# Patient Record
Sex: Female | Born: 1960 | Race: White | Hispanic: No | Smoking: Current every day smoker
Health system: Southern US, Community
[De-identification: ages and names within clinical notes are randomized; demographics above are authoritative.]

## PROBLEM LIST (undated history)

## (undated) DIAGNOSIS — A499 Bacterial infection, unspecified: Secondary | ICD-10-CM

## (undated) HISTORY — PX: BREAST LUMPECTOMY: SHX2

---

## 2015-10-22 ENCOUNTER — Emergency Department (HOSPITAL_COMMUNITY)
Admission: EM | Admit: 2015-10-22 | Discharge: 2015-10-22 | Disposition: A | Discharge status: Home / Self Care | Payer: BLUE CROSS/BLUE SHIELD | Attending: Emergency Medicine | Admitting: Emergency Medicine

## 2015-10-22 ENCOUNTER — Emergency Department (HOSPITAL_COMMUNITY): Payer: BLUE CROSS/BLUE SHIELD

## 2015-10-22 ENCOUNTER — Encounter (HOSPITAL_COMMUNITY): Payer: Self-pay | Admitting: Cardiology

## 2015-10-22 DIAGNOSIS — F17200 Nicotine dependence, unspecified, uncomplicated: Secondary | ICD-10-CM | POA: Insufficient documentation

## 2015-10-22 DIAGNOSIS — Z79899 Other long term (current) drug therapy: Secondary | ICD-10-CM | POA: Insufficient documentation

## 2015-10-22 DIAGNOSIS — R112 Nausea with vomiting, unspecified: Secondary | ICD-10-CM | POA: Diagnosis not present

## 2015-10-22 DIAGNOSIS — R531 Weakness: Secondary | ICD-10-CM | POA: Diagnosis present

## 2015-10-22 DIAGNOSIS — Z791 Long term (current) use of non-steroidal anti-inflammatories (NSAID): Secondary | ICD-10-CM | POA: Diagnosis not present

## 2015-10-22 HISTORY — DX: Bacterial infection, unspecified: A49.9

## 2015-10-22 LAB — LIPASE, BLOOD: Lipase: 28 U/L (ref 11–51)

## 2015-10-22 LAB — COMPREHENSIVE METABOLIC PANEL
ALBUMIN: 4.4 g/dL (ref 3.5–5.0)
ALT: 10 U/L — ABNORMAL LOW (ref 14–54)
ANION GAP: 9 (ref 5–15)
AST: 16 U/L (ref 15–41)
Alkaline Phosphatase: 68 U/L (ref 38–126)
BUN: 10 mg/dL (ref 6–20)
CO2: 25 mmol/L (ref 22–32)
Calcium: 9.7 mg/dL (ref 8.9–10.3)
Chloride: 103 mmol/L (ref 101–111)
Creatinine, Ser: 0.94 mg/dL (ref 0.44–1.00)
GFR calc Af Amer: >60 mL/min (ref >60)
GFR calc non Af Amer: >60 mL/min (ref >60)
GLUCOSE: 144 mg/dL — AB (ref 65–99)
POTASSIUM: 3.4 mmol/L — AB (ref 3.5–5.1)
SODIUM: 137 mmol/L (ref 135–145)
Total Bilirubin: 0.4 mg/dL (ref 0.3–1.2)
Total Protein: 7.8 g/dL (ref 6.5–8.1)

## 2015-10-22 LAB — CBC WITH DIFFERENTIAL/PLATELET
BASOS PCT: 0 %
Basophils Absolute: 0 10*3/uL (ref 0.0–0.1)
EOS ABS: 0 10*3/uL (ref 0.0–0.7)
Eosinophils Relative: 0 %
HCT: 40.4 % (ref 36.0–46.0)
Hemoglobin: 12.9 g/dL (ref 12.0–15.0)
Lymphocytes Relative: 25 %
Lymphs Abs: 2.4 10*3/uL (ref 0.7–4.0)
MCH: 28.2 pg (ref 26.0–34.0)
MCHC: 31.9 g/dL (ref 30.0–36.0)
MCV: 88.4 fL (ref 78.0–100.0)
MONO ABS: 0.7 10*3/uL (ref 0.1–1.0)
MONOS PCT: 7 %
NEUTROS PCT: 68 %
Neutro Abs: 6.4 10*3/uL (ref 1.7–7.7)
Platelets: 296 10*3/uL (ref 150–400)
RBC: 4.57 MIL/uL (ref 3.87–5.11)
RDW: 13.5 % (ref 11.5–15.5)
WBC: 9.5 10*3/uL (ref 4.0–10.5)

## 2015-10-22 LAB — URINALYSIS, ROUTINE W REFLEX MICROSCOPIC
Bilirubin Urine: NEGATIVE
Glucose, UA: NEGATIVE mg/dL
Ketones, ur: NEGATIVE mg/dL
LEUKOCYTES UA: NEGATIVE
NITRITE: NEGATIVE
PH: 6 (ref 5.0–8.0)
Protein, ur: NEGATIVE mg/dL
SPECIFIC GRAVITY, URINE: 1.01 (ref 1.005–1.030)

## 2015-10-22 LAB — URINE MICROSCOPIC-ADD ON

## 2015-10-22 LAB — TROPONIN I

## 2015-10-22 LAB — TSH: TSH: 0.51 u[IU]/mL (ref 0.350–4.500)

## 2015-10-22 LAB — MAGNESIUM: MAGNESIUM: 2.1 mg/dL (ref 1.7–2.4)

## 2015-10-22 MED ORDER — SODIUM CHLORIDE 0.9 % IV BOLUS (SEPSIS)
1000.0000 mL | Freq: Once | INTRAVENOUS | Status: AC
  Administered 2015-10-22: 1000 mL via INTRAVENOUS

## 2015-10-22 MED ORDER — ONDANSETRON 4 MG PO TBDP: 4.0000 mg | ORAL_TABLET | Freq: Three times a day (TID) | ORAL | Status: AC | PRN

## 2015-10-22 MED ORDER — ONDANSETRON 4 MG PO TBDP
4.0000 mg | ORAL_TABLET | Freq: Once | ORAL | Status: AC
  Administered 2015-10-22: 4 mg via ORAL
  Filled 2015-10-22: qty 1

## 2015-10-22 NOTE — ED Notes (Signed)
Pt states she is feeling better after first liter of fluids.

## 2015-10-22 NOTE — Discharge Instructions (Signed)
Your testing has been normal - see list below for follow up.  [facility]    Kari BaarsEdward Hawkins MD. Specialty: Pulmonary Disease Contact information: [address]  [address]  [place] [address]  [phone]   Syliva OvermanMargaret Simpson, MD. Specialty: [facility] Contact information: [address]  [place] [address]  [phone]   Lilyan PuntScott Luking, MD. Specialty: [facility] Contact information: [address]  [address]  [place] [address]  [phone]   Avon Gullyesfaye Fanta, MD Specialty: Internal Medicine Contact information: [address]  [place] [address]  [phone]   Catalina PizzaZach Hall, MD. Specialty: Internal Medicine Contact information: [address]  [place] [address]  [phone]   Butch PennyAngus Mcinnis, MD. Specialty: Family Medicine Contact information: [address]  [place] [address]  [phone]   John GiovanniStephen Knowlton, MD. Specialty: [facility] Contact information: [address]  [address]  [place] [address]  [phone]   Carylon Perchesoy Fagan, MD. Specialty: Internal Medicine Contact information: [address]  [address]  [place] [address]  [phone]

## 2015-10-22 NOTE — ED Notes (Signed)
Weakness and nausea times one week.

## 2015-10-22 NOTE — ED Provider Notes (Signed)
CSN: [id number]     Arrival date & time 10/22/15  1330 History   First MD Initiated Contact with Patient 10/22/15 1349     Chief Complaint  Patient presents with  . Weakness     (Consider location/radiation/quality/duration/timing/severity/associated sxs/prior Treatment) HPI  The patient is a 55 year old female, she denies any prior major medical issues or surgical problems, she does endorse [facility] one [facility] during which time she had what she describes as a bacterial infection of her bowels. He was told she needed a colonoscopy but did not follow up. Over the last year she has done very well without any complaints however approximately one week ago she developed nausea without vomiting and generalized fatigue which has seemed to worsen over the last week. She is called out of work yesterday because of her symptoms. She denies fevers, chills, coughing, shortness of breath, chest pain, abdominal pain, back pain, swelling, rashes, headaches, blurred vision or sore throat. She does endorse having a couple of tick bites over the last couple of months but states that she is always pulling ticks off of her and has never had any rashes. She denies any fevers. She has had very little to eat or drink the last 2 days.  Past Medical History  Diagnosis Date  . Bacterial infection    Past Surgical History  Procedure Laterality Date  . Breast lumpectomy     History reviewed. No pertinent family history. Social History  Substance Use Topics  . Smoking status: Current Every Day Smoker  . Smokeless tobacco: None  . Alcohol Use: No   OB History    No data available     Review of Systems  All other systems reviewed and are negative.     Allergies  Review of patient's allergies indicates no known allergies.  Home Medications   Prior to Admission medications   Medication Sig Start Date End Date Taking? Authorizing Provider  ibuprofen (ADVIL,MOTRIN)  200 MG tablet Take 400 mg by mouth every 6 (six) hours as needed for moderate pain.   Yes Historical Provider, MD  Multiple Vitamin (MULTIVITAMIN WITH MINERALS) TABS tablet Take 1 tablet by mouth daily.   Yes Historical Provider, MD  ranitidine (ZANTAC) 150 MG capsule Take 150 mg by mouth daily as needed for heartburn.   Yes Historical Provider, MD  ondansetron (ZOFRAN ODT) 4 MG disintegrating tablet Take 1 tablet (4 mg total) by mouth every 8 (eight) hours as needed for nausea. 10/22/15   Eber HongBrian Kade Rickels, MD   BP 113/72 mmHg  Pulse 51  Temp(Src) 98.1 F (36.7 C) (Oral)  Resp 24  Ht 5\' 8"  (1.727 m)  Wt 140 lb (63.504 kg)  BMI 21.29 kg/m2  SpO2 97%  LMP  Physical Exam  Constitutional: She appears well-developed and well-nourished. No distress.  HENT:  Head: Normocephalic and atraumatic.  Mouth/Throat: No oropharyngeal exudate.  Mucous membranes are mildly dehydrated  Eyes: Conjunctivae and EOM are normal. Pupils are equal, round, and reactive to light. Right eye exhibits no discharge. Left eye exhibits no discharge. No scleral icterus.  Neck: Normal range of motion. Neck supple. No JVD present. No thyromegaly present.  Cardiovascular: Regular rhythm, normal heart sounds and intact distal pulses.  Exam reveals no gallop and no friction rub.   No murmur heard. Tachycardia, strong pulses, no JVD  Pulmonary/Chest: Effort normal and breath sounds normal. No respiratory distress. She has no wheezes. She has no rales.  Abdominal: Soft. Bowel sounds  are normal. She exhibits no distension and no mass. There is tenderness ( m minimal epigastric tenderness, abdominal aorta palpated).  Musculoskeletal: Normal range of motion. She exhibits no edema or tenderness.  Lymphadenopathy:    She has no cervical adenopathy.  Neurological: She is alert. Coordination normal.  Normal strength and sensation of all 4 extremities.  Speech is clear, cranial nerves III through XII are normal, gait is normal,  coordination is normal  Skin: Skin is warm and dry. No rash noted. No erythema.  Psychiatric: She has a normal mood and affect. Her behavior is normal.  Nursing note and vitals reviewed.   ED Course  Procedures (including critical care time) Labs Review Labs Reviewed  COMPREHENSIVE METABOLIC PANEL - Abnormal; Notable for the following:    Potassium 3.4 (*)    Glucose, Bld 144 (*)    ALT 10 (*)    All other components within normal limits  URINALYSIS, ROUTINE W REFLEX MICROSCOPIC (NOT AT [facility]) - Abnormal; Notable for the following:    Hgb urine dipstick MODERATE (*)    All other components within normal limits  URINE MICROSCOPIC-ADD ON - Abnormal; Notable for the following:    Squamous Epithelial / LPF 0-5 (*)    Bacteria, UA RARE (*)    All other components within normal limits  CBC WITH DIFFERENTIAL/PLATELET  LIPASE, BLOOD  MAGNESIUM  TROPONIN I  TSH    Imaging Review US Aorta  10/22/2015  CLINICAL DATA:  Pulsatile abdominal mass. EXAM: ULTRASOUND OF ABDOMINAL AORTA TECHNIQUE: Ultrasound examination of the abdominal aorta was performed to evaluate for abdominal aortic aneurysm. COMPARISON:  None. FINDINGS: Abdominal Aorta Proximal:  2.3 x 2.4 cm Mid:  1.8 x 1.4 cm Distal:  1.2 x 1.2 cm Right Common iliac artery:  9 x 9 mm Left Common iliac artery:  8 x 9 mm Small amount of calcified plaque noted. IMPRESSION: No abdominal aortic aneurysm. Electronically Signed   By: Rudie Meyer M.D.   On: 10/22/2015 14:48   I have personally reviewed and evaluated these images and lab results as part of my medical decision-making.   EKG Interpretation   Date/Time:  Friday October 22 2015 14:48:26 EDT Ventricular Rate:  54 PR Interval:  161 QRS Duration: 89 QT Interval:  433 QTC Calculation: 410 R Axis:   75 Text Interpretation:  Sinus bradycardia ECG OTHERWISE WITHIN NORMAL LIMITS  No old tracing to compare Confirmed by Kaylor Simenson  MD, Roshaun Pound ([id number]) on  10/22/2015 3:17:40 PM      MDM    Final diagnoses:  Non-intractable vomiting with nausea, vomiting of unspecified type    The patient has an exam consistent with mild hypertension, mild tachycardia, she does appear dehydrated. The source of her fatigue and malaise is unclear. Her nausea is nonspecific, we'll check a variety of tests including electrolytes, kidney function, urinalysis, blood counts, thyroid panel and an EKG. The patient is in agreement with the plan. Will give IV fluids and Zofran.  Labs unremarkable - well appearing, no c/o at this time  Pt given copy of results - stable for d/c.  Meds given in ED:  Medications  sodium chloride 0.9 % bolus 1,000 mL (0 mLs Intravenous Stopped 10/22/15 1505)  sodium chloride 0.9 % bolus 1,000 mL (0 mLs Intravenous Stopped 10/22/15 1657)  ondansetron (ZOFRAN-ODT) disintegrating tablet 4 mg (4 mg Oral Given 10/22/15 1414)    New Prescriptions   ONDANSETRON (ZOFRAN ODT) 4 MG DISINTEGRATING TABLET    Take 1 tablet (4  mg total) by mouth every 8 (eight) hours as needed for nausea.      Eber Hong, MD 10/22/15 [phone]

## 2017-12-09 IMAGING — US US AORTA
1 series · 14 of 20 positions shown · non-contrast
Comparison: None.

CLINICAL DATA: Pulsatile abdominal mass.

EXAM:
ULTRASOUND OF ABDOMINAL AORTA
TECHNIQUE: Ultrasound examination of the abdominal aorta was performed to
evaluate for abdominal aortic aneurysm.

[Series 1: us aorta · 0.22mm/px · 14 of 20 slices shown]
[im 1/20]
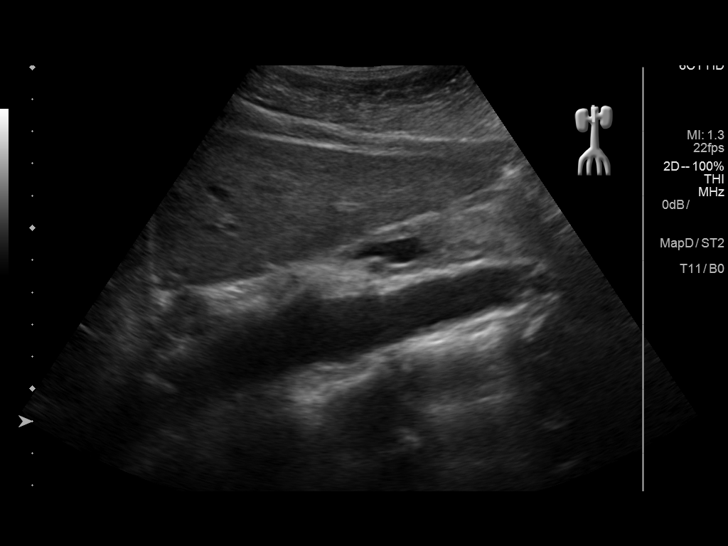
[im 3/20]
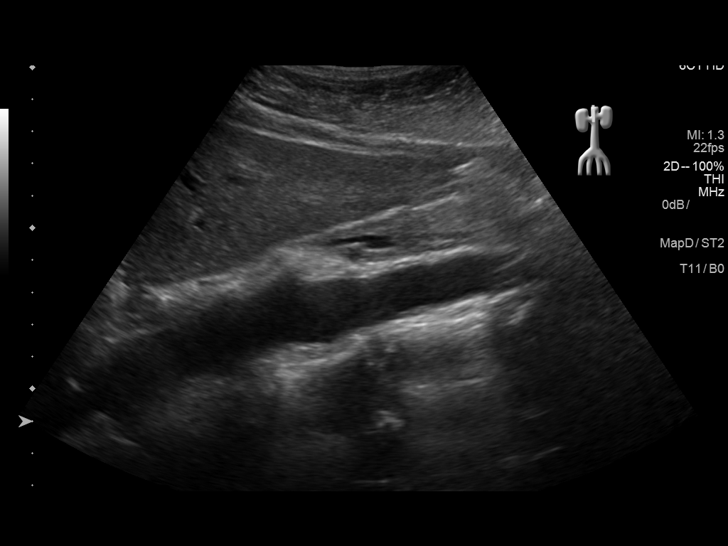
[im 4/20]
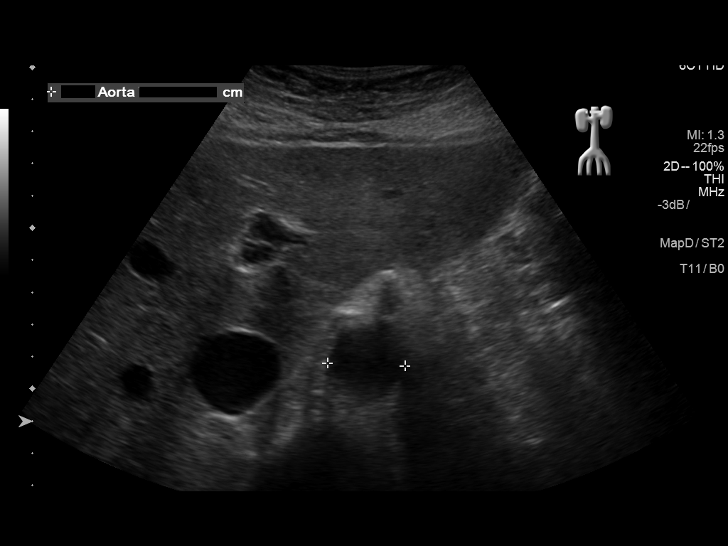
[im 6/20]
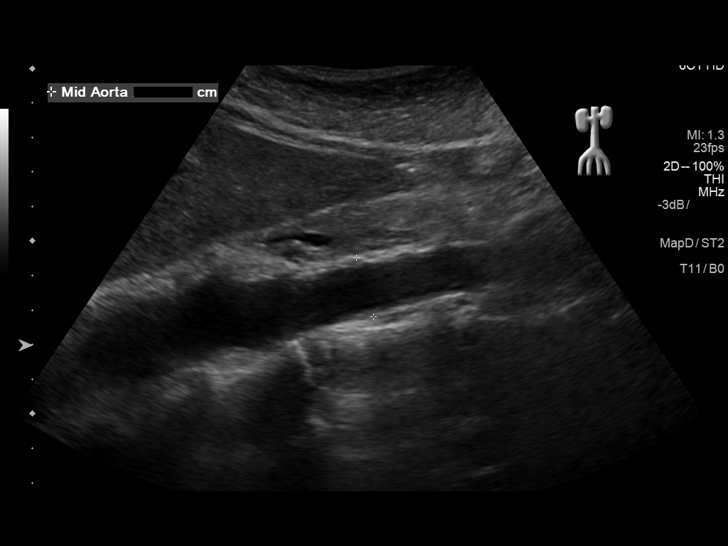
[im 7/20]
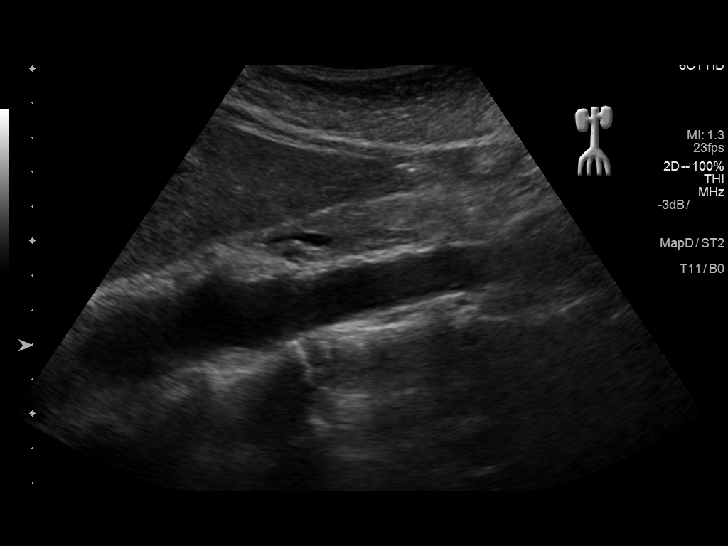
[im 8/20]
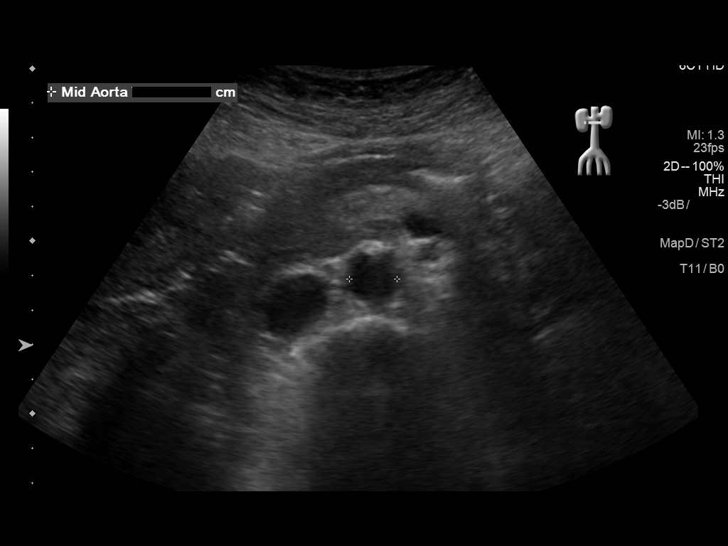
[im 10/20]
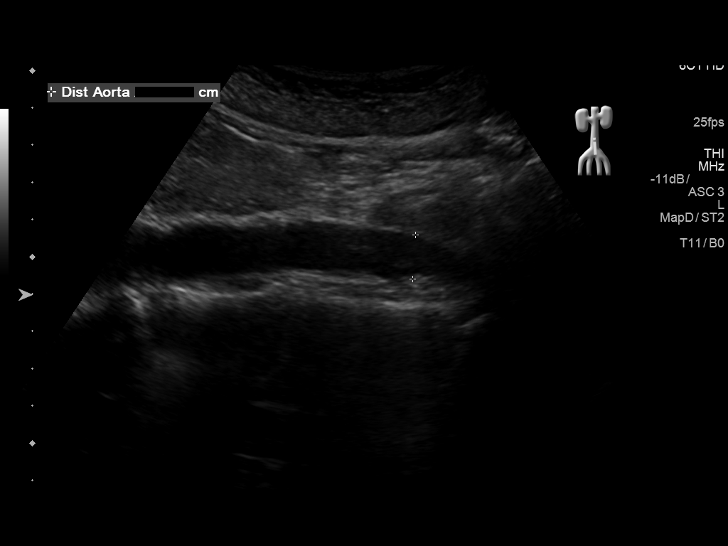
[im 11/20]
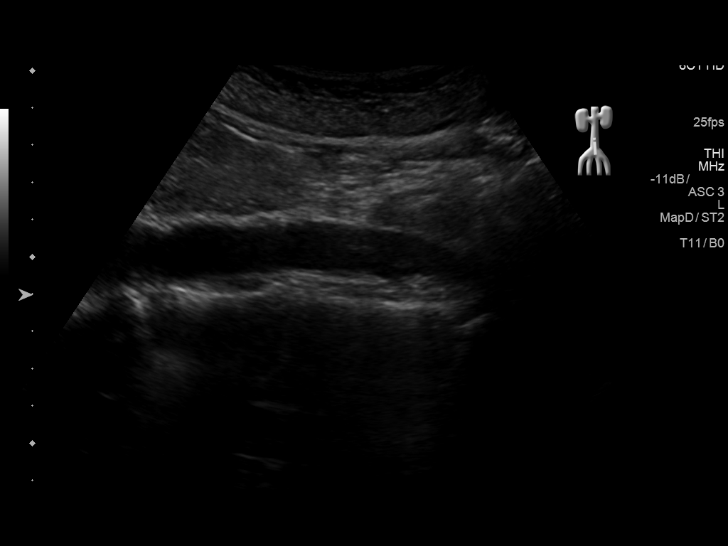
[im 13/20]
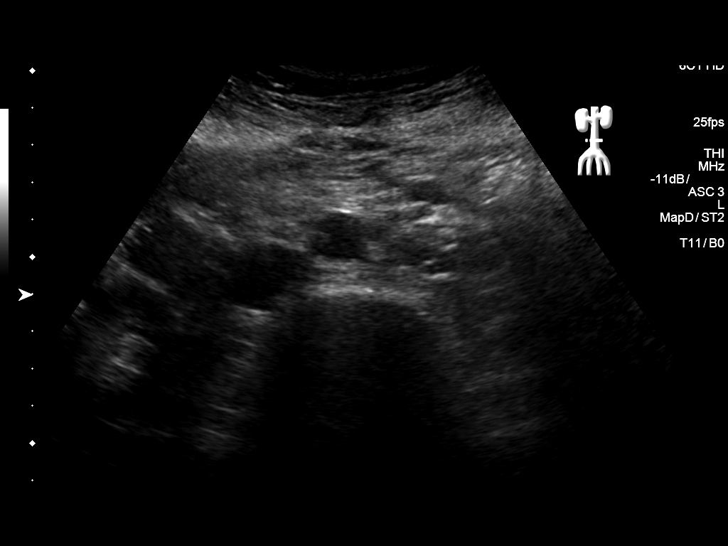
[im 14/20]
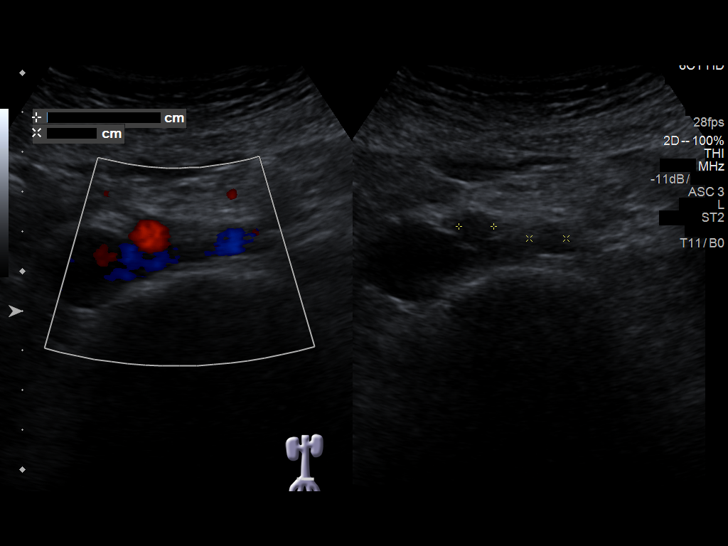
[im 16/20]
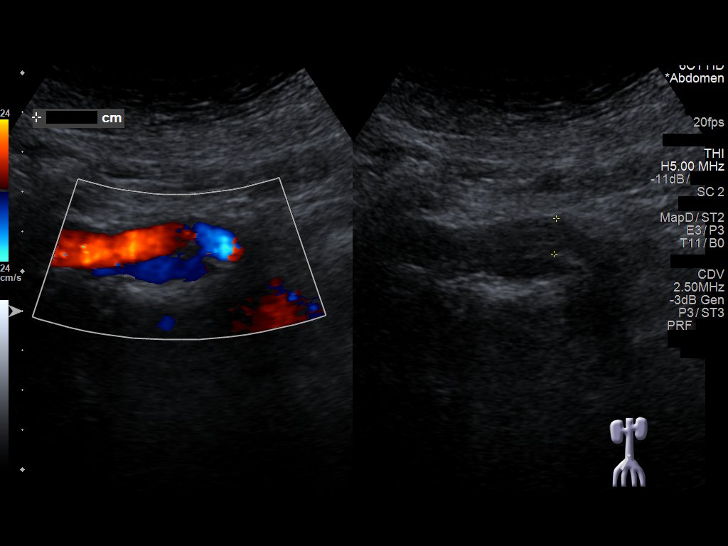
[im 17/20]
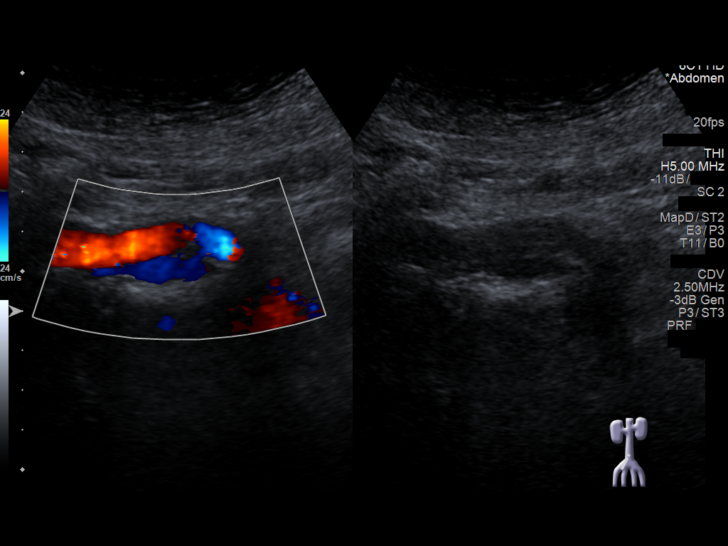
[im 18/20]
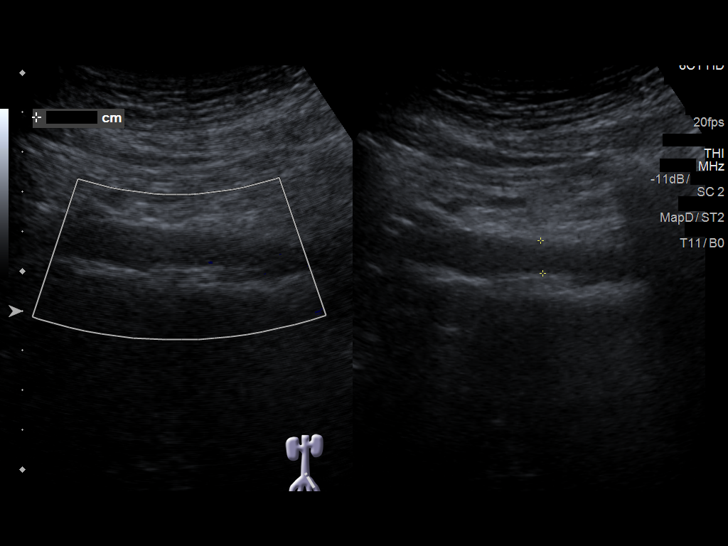
[im 20/20]
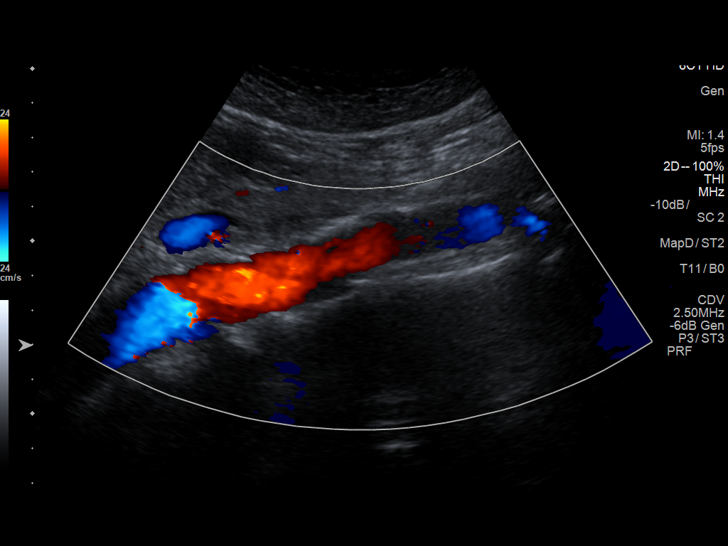

[14 of 20 positions shown; findings below may reference images not displayed]

FINDINGS: Abdominal Aorta

Proximal:  2.3 x 2.4 cm

Mid:  1.8 x 1.4 cm

Distal:  1.2 x 1.2 cm

Right Common iliac artery:  9 x 9 mm

Left Common iliac artery:  8 x 9 mm

Small amount of calcified plaque noted.
IMPRESSION: No abdominal aortic aneurysm.
